# Patient Record
Sex: Female | Born: 1965 | Marital: Married | Smoking: Never smoker
Health system: Southern US, Community
[De-identification: ages and names within clinical notes are randomized; demographics above are authoritative.]

## PROBLEM LIST (undated history)

## (undated) DIAGNOSIS — Z9289 Personal history of other medical treatment: Secondary | ICD-10-CM

## (undated) DIAGNOSIS — E785 Hyperlipidemia, unspecified: Secondary | ICD-10-CM

## (undated) HISTORY — DX: Hyperlipidemia, unspecified: E78.5

## (undated) HISTORY — DX: Personal history of other medical treatment: Z92.89

---

## 2009-01-11 ENCOUNTER — Ambulatory Visit: Payer: Self-pay | Admitting: Obstetrics and Gynecology

## 2012-07-15 ENCOUNTER — Ambulatory Visit: Payer: Self-pay

## 2013-05-11 ENCOUNTER — Ambulatory Visit: Payer: Self-pay | Admitting: Family Medicine

## 2013-05-11 LAB — COMPREHENSIVE METABOLIC PANEL
Albumin: 3.7 g/dL (ref 3.4–5.0)
Alkaline Phosphatase: 84 U/L (ref 50–136)
Anion Gap: 8 (ref 7–16)
BUN: 12 mg/dL (ref 7–18)
Bilirubin,Total: 0.2 mg/dL (ref 0.2–1.0)
Calcium, Total: 9.4 mg/dL (ref 8.5–10.1)
Co2: 31 mmol/L (ref 21–32)
Creatinine: 1.01 mg/dL (ref 0.60–1.30)
EGFR (African American): >60
EGFR (Non-African Amer.): >60
Glucose: 108 mg/dL — ABNORMAL HIGH (ref 65–99)
Osmolality: 285 (ref 275–301)
Potassium: 4 mmol/L (ref 3.5–5.1)
SGPT (ALT): 29 U/L (ref 12–78)
Total Protein: 7.7 g/dL (ref 6.4–8.2)

## 2013-05-11 LAB — CBC WITH DIFFERENTIAL/PLATELET
Basophil %: 0.4 %
HCT: 37.8 % (ref 35.0–47.0)
HGB: 12.1 g/dL (ref 12.0–16.0)
Lymphocyte %: 36.1 %
MCH: 24.5 pg — ABNORMAL LOW (ref 26.0–34.0)
MCHC: 32 g/dL (ref 32.0–36.0)
MCV: 77 fL — ABNORMAL LOW (ref 80–100)
Monocyte #: 0.4 x10 3/mm (ref 0.2–0.9)
Monocyte %: 8.6 %
Platelet: 249 10*3/uL (ref 150–440)
RBC: 4.94 10*6/uL (ref 3.80–5.20)
RDW: 14.6 % — ABNORMAL HIGH (ref 11.5–14.5)
WBC: 5.2 10*3/uL (ref 3.6–11.0)

## 2013-05-11 LAB — TROPONIN I: Troponin-I: <0.02 ng/mL

## 2013-05-11 LAB — TSH: Thyroid Stimulating Horm: 1.05 u[IU]/mL

## 2014-06-13 IMAGING — CT CT HEAD WITHOUT AND WITH CONTRAST
1 of 2 series · 13 of 30 positions shown, 17 images · IV contrast (isovue)
Comparison: none

REASON FOR EXAM: ext pain tingling  numbness eval for mass or lesion
COMMENTS:

PROCEDURE:     [facility] - [facility]ANY [facility]/WAL  - May 11, 2013  [DATE]
RESULT:     Comparison:  None
TECHNIQUE: Multiple axial images from the foramen magnum to the vertex were
obtained with and without 50 mL Isovue 300 IV contrast.

[Series 2: soft tissue wo · axial · 0.42mm/px · z∈[-136,-16]mm · 13 of 30 slices shown, 17 images]
[im 3/30  brain]
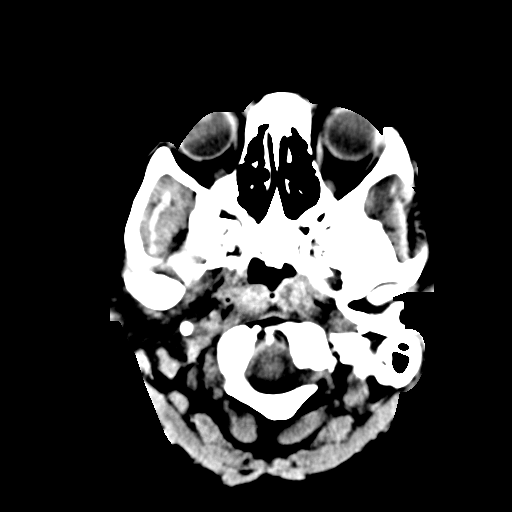
[im 3/30  bone]
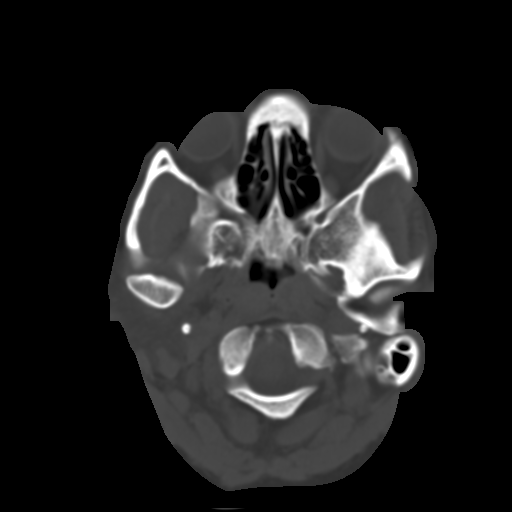
[im 5/30  brain]
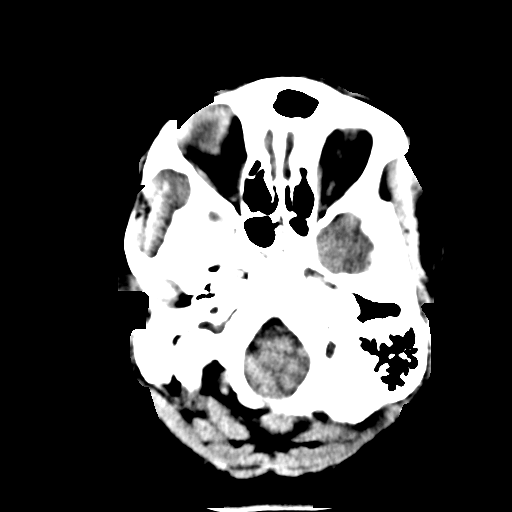
[im 7/30  brain]
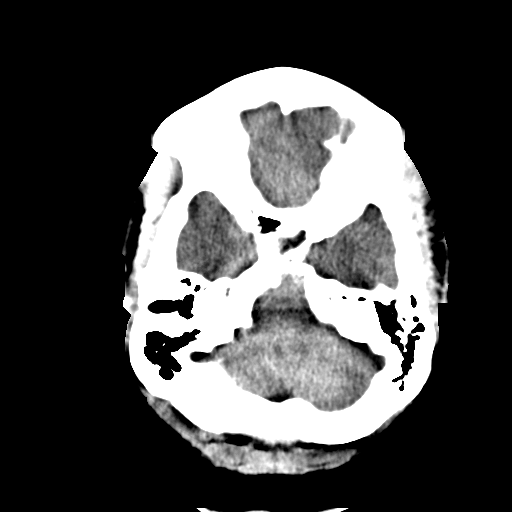
[im 9/30  brain]
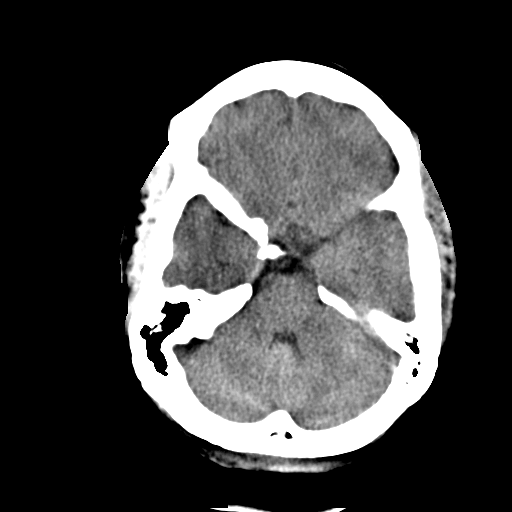
[im 11/30  brain]
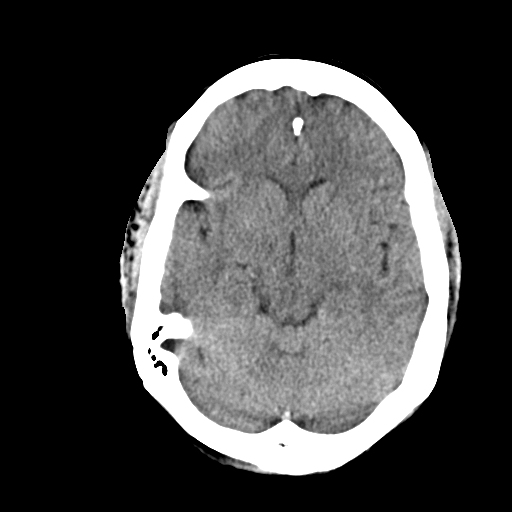
[im 11/30  bone]
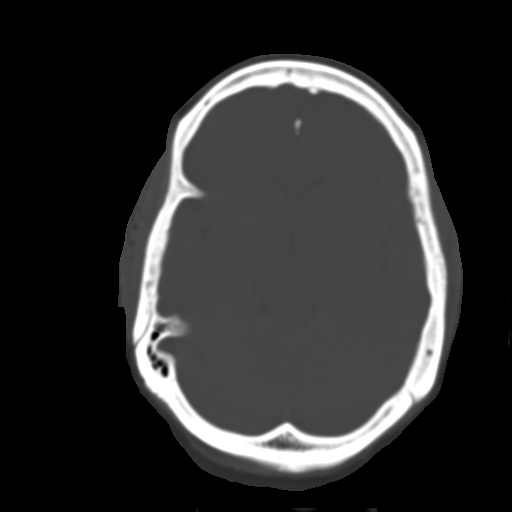
[im 13/30  brain]
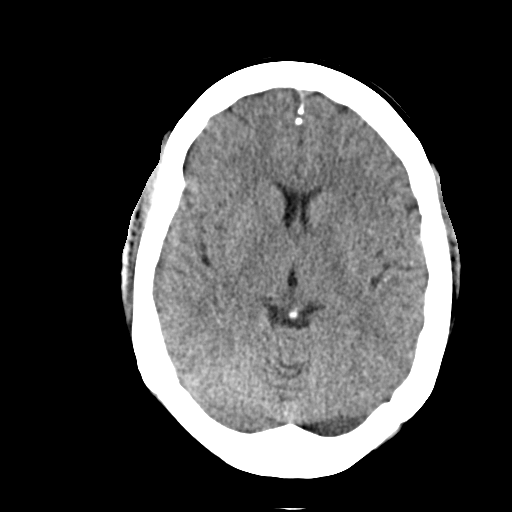
[im 15/30  brain]
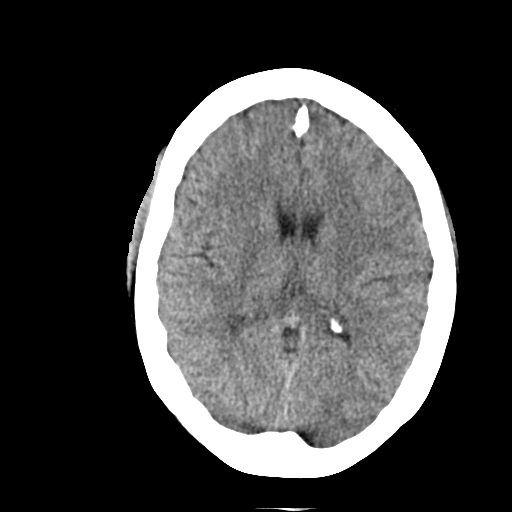
[im 17/30  brain]
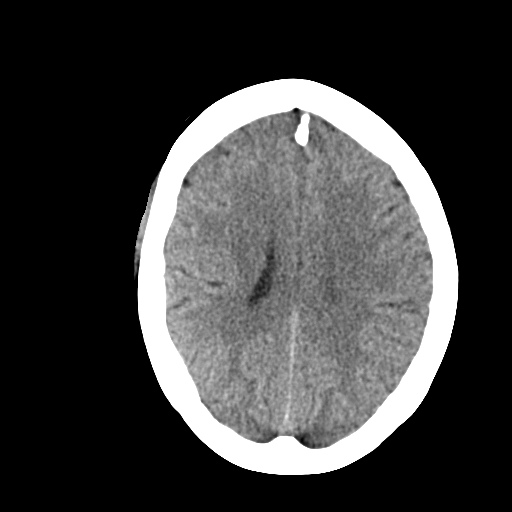
[im 19/30  brain]
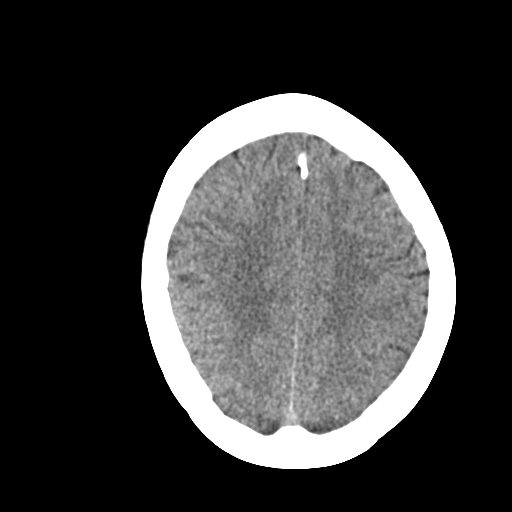
[im 19/30  bone]
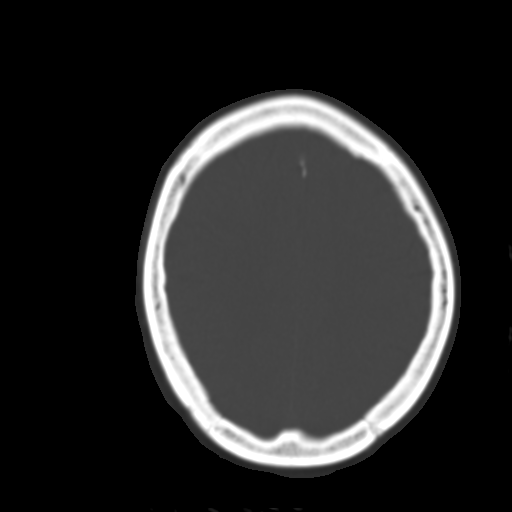
[im 21/30  brain]
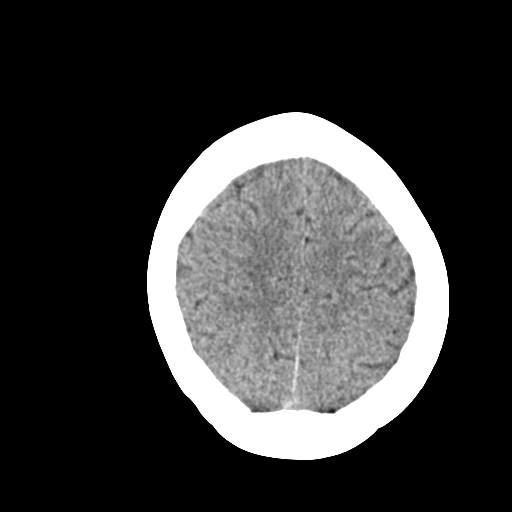
[im 23/30  brain]
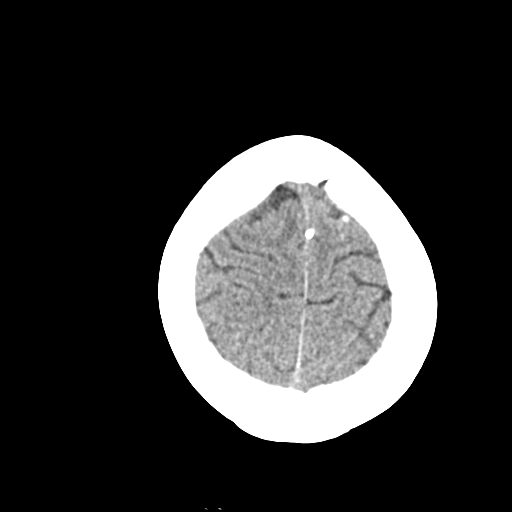
[im 25/30  brain]
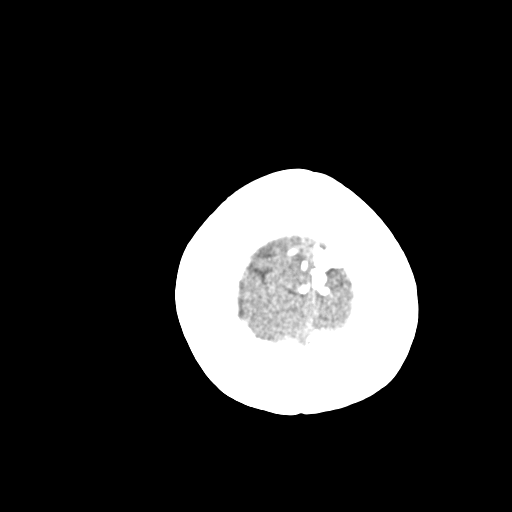
[im 27/30  brain]
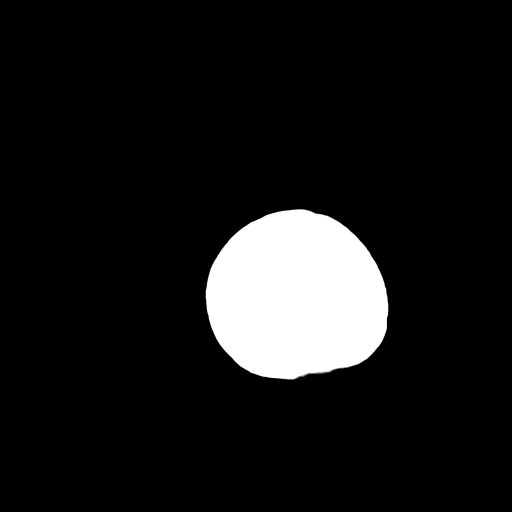
[im 27/30  bone]
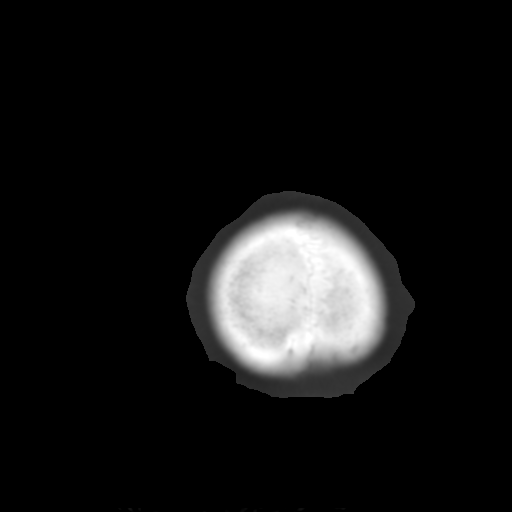

[13 of 30 positions shown; findings below may reference images not displayed]

FINDINGS: There is no evidence for mass effect, midline shift, or extra-axial fluid
collections. There is no evidence for space-occupying lesion, intracranial
hemorrhage, or cortical-based area of infarction. No abnormal areas of
enhancement.

The osseous structures are unremarkable.
IMPRESSION: No acute intracranial findings. No abnormal areas of enhancement.

If there is continued clinical concern, further evaluation could be provided
with contrast enhanced brain MRI.

## 2015-03-16 LAB — HM MAMMOGRAPHY

## 2015-03-22 LAB — HM PAP SMEAR

## 2015-10-25 ENCOUNTER — Encounter: Payer: Self-pay | Admitting: Internal Medicine

## 2015-10-25 DIAGNOSIS — E785 Hyperlipidemia, unspecified: Secondary | ICD-10-CM | POA: Insufficient documentation

## 2017-01-28 DIAGNOSIS — I10 Essential (primary) hypertension: Secondary | ICD-10-CM | POA: Insufficient documentation

## 2017-12-25 ENCOUNTER — Other Ambulatory Visit: Payer: Self-pay | Admitting: Family Medicine

## 2017-12-25 DIAGNOSIS — Z1239 Encounter for other screening for malignant neoplasm of breast: Secondary | ICD-10-CM

## 2017-12-29 ENCOUNTER — Encounter: Payer: Self-pay | Admitting: Advanced Practice Midwife

## 2017-12-29 ENCOUNTER — Ambulatory Visit (INDEPENDENT_AMBULATORY_CARE_PROVIDER_SITE_OTHER): Payer: BC Managed Care – PPO | Admitting: Advanced Practice Midwife

## 2017-12-29 VITALS — BP 130/80 | HR 66 | Ht 65.75 in | Wt 189.0 lb

## 2017-12-29 DIAGNOSIS — Z01419 Encounter for gynecological examination (general) (routine) without abnormal findings: Secondary | ICD-10-CM

## 2017-12-29 DIAGNOSIS — Z124 Encounter for screening for malignant neoplasm of cervix: Secondary | ICD-10-CM | POA: Diagnosis not present

## 2017-12-29 NOTE — Patient Instructions (Signed)
American Heart Association (AHA) Exercise Recommendation  Being physically active is important to prevent heart disease and stroke, the nation's No. 1and No. 5killers. To improve overall cardiovascular health, we suggest at least 150 minutes per week of moderate exercise or 75 minutes per week of vigorous exercise (or a combination of moderate and vigorous activity). Thirty minutes a day, five times a week is an easy goal to remember. You will also experience benefits even if you divide your time into two or three segments of 10 to 15 minutes per day.  For people who would benefit from lowering their blood pressure or cholesterol, we recommend 40 minutes of aerobic exercise of moderate to vigorous intensity three to four times a week to lower the risk for heart attack and stroke.  Physical activity is anything that makes you move your body and burn calories.  This includes things like climbing stairs or playing sports. Aerobic exercises benefit your heart, and include walking, jogging, swimming or biking. Strength and stretching exercises are best for overall stamina and flexibility.  The simplest, positive change you can make to effectively improve your heart health is to start walking. It's enjoyable, free, easy, social and great exercise. A walking program is flexible and boasts high success rates because people can stick with it. It's easy for walking to become a regular and satisfying part of life.   For Overall Cardiovascular Health:  At least 30 minutes of moderate-intensity aerobic activity at least 5 days per week for a total of 150  OR   At least 25 minutes of vigorous aerobic activity at least 3 days per week for a total of 75 minutes; or a combination of moderate- and vigorous-intensity aerobic activity  AND   Moderate- to high-intensity muscle-strengthening activity at least 2 days per week for additional health benefits.  For Lowering Blood Pressure and Cholesterol  An  average 40 minutes of moderate- to vigorous-intensity aerobic activity 3 or 4 times per week  What if I can't make it to the time goal? Something is always better than nothing! And everyone has to start somewhere. Even if you've been sedentary for years, today is the day you can begin to make healthy changes in your life. If you don't think you'll make it for 30 or 40 minutes, set a reachable goal for today. You can work up toward your overall goal by increasing your time as you get stronger. Don't let all-or-nothing thinking rob you of doing what you can every day.  Source:http://www.heart.org   Mediterranean Diet A Mediterranean diet refers to food and lifestyle choices that are based on the traditions of countries located on the [employer]. This way of eating has been shown to help prevent certain conditions and improve outcomes for people who have chronic diseases, like kidney disease and heart disease. What are tips for following this plan? Lifestyle  Cook and eat meals together with your family, when possible.  Drink enough fluid to keep your urine clear or pale yellow.  Be physically active every day. This includes: ? Aerobic exercise like running or swimming. ? Leisure activities like gardening, walking, or housework.  Get 7-8 hours of sleep each night.  If recommended by your health care provider, drink red wine in moderation. This means 1 glass a day for nonpregnant women and 2 glasses a day for men. A glass of wine equals 5 oz (150 mL). Reading food labels  Check the serving size of packaged foods. For foods such as rice  and pasta, the serving size refers to the amount of cooked product, not dry.  Check the total fat in packaged foods. Avoid foods that have saturated fat or trans fats.  Check the ingredients list for added sugars, such as corn syrup. Shopping  At the grocery store, buy most of your food from the areas near the walls of the store. This  includes: ? Fresh fruits and vegetables (produce). ? Grains, beans, nuts, and seeds. Some of these may be available in unpackaged forms or large amounts (in bulk). ? Fresh seafood. ? Poultry and eggs. ? Low-fat dairy products.  Buy whole ingredients instead of prepackaged foods.  Buy fresh fruits and vegetables in-season from local farmers markets.  Buy frozen fruits and vegetables in resealable bags.  If you do not have access to quality fresh seafood, buy precooked frozen shrimp or canned fish, such as tuna, salmon, or sardines.  Buy small amounts of raw or cooked vegetables, salads, or olives from the deli or salad bar at your store.  Stock your pantry so you always have certain foods on hand, such as olive oil, canned tuna, canned tomatoes, rice, pasta, and beans. Cooking  Cook foods with extra-virgin olive oil instead of using butter or other vegetable oils.  Have meat as a side dish, and have vegetables or grains as your main dish. This means having meat in small portions or adding small amounts of meat to foods like pasta or stew.  Use beans or vegetables instead of meat in common dishes like chili or lasagna.  Experiment with different cooking methods. Try roasting or broiling vegetables instead of steaming or sauteing them.  Add frozen vegetables to soups, stews, pasta, or rice.  Add nuts or seeds for added healthy fat at each meal. You can add these to yogurt, salads, or vegetable dishes.  Marinate fish or vegetables using olive oil, lemon juice, garlic, and fresh herbs. Meal planning  Plan to eat 1 vegetarian meal one day each week. Try to work up to 2 vegetarian meals, if possible.  Eat seafood 2 or more times a week.  Have healthy snacks readily available, such as: ? Vegetable sticks with hummus. ? [place] yogurt. ? Fruit and nut trail mix.  Eat balanced meals throughout the week. This includes: ? Fruit: 2-3 servings a day ? Vegetables: 4-5 servings a  day ? Low-fat dairy: 2 servings a day ? Fish, poultry, or lean meat: 1 serving a day ? Beans and legumes: 2 or more servings a week ? Nuts and seeds: 1-2 servings a day ? Whole grains: 6-8 servings a day ? Extra-virgin olive oil: 3-4 servings a day  Limit red meat and sweets to only a few servings a month What are my food choices?  Mediterranean diet ? Recommended ? Grains: Whole-grain pasta. Brown rice. Bulgar wheat. Polenta. Couscous. Whole-wheat bread. [place]. ? Vegetables: Artichokes. Beets. Broccoli. Cabbage. Carrots. Eggplant. Green beans. Chard. Kale. Spinach. Onions. Leeks. Peas. Squash. Tomatoes. Peppers. Radishes. ? Fruits: Apples. Apricots. Avocado. Berries. Bananas. Cherries. Dates. Figs. Grapes. Lemons. Melon. Oranges. Peaches. Plums. Pomegranate. ? Meats and other protein foods: Beans. Almonds. Sunflower seeds. Pine nuts. Peanuts. [place]. Salmon. Scallops. Shrimp. [place]. Tilapia. Clams. Oysters. Eggs. ? Dairy: Low-fat milk. Cheese. Greek yogurt. ? Beverages: Water. Red wine. Herbal tea. ? Fats and oils: Extra virgin olive oil. Avocado oil. Grape seed oil. ? Sweets and desserts: [place] yogurt with honey. Baked apples. Poached pears. Trail mix. ? Seasoning and other foods: Basil. Cilantro. Coriander.  Cumin. Mint. Parsley. Sage. Rosemary. Tarragon. Garlic. Oregano. Thyme. Pepper. Balsalmic vinegar. Tahini. Hummus. Tomato sauce. Olives. Mushrooms. ? Limit these ? Grains: Prepackaged pasta or rice dishes. Prepackaged cereal with added sugar. ? Vegetables: Deep fried potatoes (french fries). ? Fruits: Fruit canned in syrup. ? Meats and other protein foods: Beef. Pork. Lamb. Poultry with skin. Hot dogs. Bacon. ? Dairy: Ice cream. Sour cream. Whole milk. ? Beverages: Juice. Sugar-sweetened soft drinks. Beer. Liquor and spirits. ? Fats and oils: Butter. Canola oil. Vegetable oil. Beef fat (tallow). Lard. ? Sweets and desserts: Cookies. Cakes. Pies. Candy. ? Seasoning and other  foods: Mayonnaise. Premade sauces and marinades. ? The items listed may not be a complete list. Talk with your dietitian about what dietary choices are right for you. Summary  The Mediterranean diet includes both food and lifestyle choices.  Eat a variety of fresh fruits and vegetables, beans, nuts, seeds, and whole grains.  Limit the amount of red meat and sweets that you eat.  Talk with your health care provider about whether it is safe for you to drink red wine in moderation. This means 1 glass a day for nonpregnant women and 2 glasses a day for men. A glass of wine equals 5 oz (150 mL). This information is not intended to replace advice given to you by your health care provider. Make sure you discuss any questions you have with your health care provider. Document Released: 06/19/2016 Document Revised: 07/22/2016 Document Reviewed: 06/19/2016 Elsevier Interactive Patient Education  2018 [employer] Health Maintenance, Female Adopting a healthy lifestyle and getting preventive care can go a long way to promote health and wellness. Talk with your health care provider about what schedule of regular examinations is right for you. This is a good chance for you to check in with your provider about disease prevention and staying healthy. In between checkups, there are plenty of things you can do on your own. Experts have done a lot of research about which lifestyle changes and preventive measures are most likely to keep you healthy. Ask your health care provider for more information. Weight and diet Eat a healthy diet  Be sure to include plenty of vegetables, fruits, low-fat dairy products, and lean protein.  Do not eat a lot of foods high in solid fats, added sugars, or salt.  Get regular exercise. This is one of the most important things you can do for your health. ? Most adults should exercise for at least 150 minutes each week. The exercise should increase your heart rate and make you  sweat (moderate-intensity exercise). ? Most adults should also do strengthening exercises at least twice a week. This is in addition to the moderate-intensity exercise.  Maintain a healthy weight  Body mass index (BMI) is a measurement that can be used to identify possible weight problems. It estimates body fat based on height and weight. Your health care provider can help determine your BMI and help you achieve or maintain a healthy weight.  For females 20 years of age and older: ? A BMI below 18.5 is considered underweight. ? A BMI of 18.5 to 24.9 is normal. ? A BMI of 25 to 29.9 is considered overweight. ? A BMI of 30 and above is considered obese.  Watch levels of cholesterol and blood lipids  You should start having your blood tested for lipids and cholesterol at 53 years of age, then have this test every 5 years.  You may need to have your cholesterol levels   checked more often if: ? Your lipid or cholesterol levels are high. ? You are older than 53 years of age. ? You are at high risk for heart disease.  Cancer screening Lung Cancer  Lung cancer screening is recommended for adults 34-81 years old who are at high risk for lung cancer because of a history of smoking.  A yearly low-dose CT scan of the lungs is recommended for people who: ? Currently smoke. ? Have quit within the past 15 years. ? Have at least a 30-pack-year history of smoking. A pack year is smoking an average of one pack of cigarettes a day for 1 year.  Yearly screening should continue until it has been 15 years since you quit.  Yearly screening should stop if you develop a health problem that would prevent you from having lung cancer treatment.  Breast Cancer  Practice breast self-awareness. This means understanding how your breasts normally appear and feel.  It also means doing regular breast self-exams. Let your health care provider know about any changes, no matter how small.  If you are in your 20s  or 30s, you should have a clinical breast exam (CBE) by a health care provider every 1-3 years as part of a regular health exam.  If you are 62 or older, have a CBE every year. Also consider having a breast X-ray (mammogram) every year.  If you have a family history of breast cancer, talk to your health care provider about genetic screening.  If you are at high risk for breast cancer, talk to your health care provider about having an MRI and a mammogram every year.  Breast cancer gene (BRCA) assessment is recommended for women who have family members with BRCA-related cancers. BRCA-related cancers include: ? Breast. ? Ovarian. ? Tubal. ? Peritoneal cancers.  Results of the assessment will determine the need for genetic counseling and BRCA1 and BRCA2 testing.  Cervical Cancer Your health care provider may recommend that you be screened regularly for cancer of the pelvic organs (ovaries, uterus, and vagina). This screening involves a pelvic examination, including checking for microscopic changes to the surface of your cervix (Pap test). You may be encouraged to have this screening done every 3 years, beginning at age 8.  For women ages 52-65, health care providers may recommend pelvic exams and Pap testing every 3 years, or they may recommend the Pap and pelvic exam, combined with testing for human papilloma virus (HPV), every 5 years. Some types of HPV increase your risk of cervical cancer. Testing for HPV may also be done on women of any age with unclear Pap test results.  Other health care providers may not recommend any screening for nonpregnant women who are considered low risk for pelvic cancer and who do not have symptoms. Ask your health care provider if a screening pelvic exam is right for you.  If you have had past treatment for cervical cancer or a condition that could lead to cancer, you need Pap tests and screening for cancer for at least 20 years after your treatment. If Pap tests  have been discontinued, your risk factors (such as having a new sexual partner) need to be reassessed to determine if screening should resume. Some women have medical problems that increase the chance of getting cervical cancer. In these cases, your health care provider may recommend more frequent screening and Pap tests.  Colorectal Cancer  This type of cancer can be detected and often prevented.  Routine colorectal cancer screening  usually begins at 53 years of age and continues through 53 years of age.  Your health care provider may recommend screening at an earlier age if you have risk factors for colon cancer.  Your health care provider may also recommend using home test kits to check for hidden blood in the stool.  A small camera at the end of a tube can be used to examine your colon directly (sigmoidoscopy or colonoscopy). This is done to check for the earliest forms of colorectal cancer.  Routine screening usually begins at age 8.  Direct examination of the colon should be repeated every 5-10 years through 53 years of age. However, you may need to be screened more often if early forms of precancerous polyps or small growths are found.  Skin Cancer  Check your skin from head to toe regularly.  Tell your health care provider about any new moles or changes in moles, especially if there is a change in a mole's shape or color.  Also tell your health care provider if you have a mole that is larger than the size of a pencil eraser.  Always use sunscreen. Apply sunscreen liberally and repeatedly throughout the day.  Protect yourself by wearing long sleeves, pants, a wide-brimmed hat, and sunglasses whenever you are outside.  Heart disease, diabetes, and high blood pressure  High blood pressure causes heart disease and increases the risk of stroke. High blood pressure is more likely to develop in: ? People who have blood pressure in the high end of the normal range (130-139/85-89 mm  Hg). ? People who are overweight or obese. ? People who are African American.  If you are 29-23 years of age, have your blood pressure checked every 3-5 years. If you are 68 years of age or older, have your blood pressure checked every year. You should have your blood pressure measured twice-once when you are at a hospital or clinic, and once when you are not at a hospital or clinic. Record the average of the two measurements. To check your blood pressure when you are not at a hospital or clinic, you can use: ? An automated blood pressure machine at a pharmacy. ? A home blood pressure monitor.  If you are between 82 years and 71 years old, ask your health care provider if you should take aspirin to prevent strokes.  Have regular diabetes screenings. This involves taking a blood sample to check your fasting blood sugar level. ? If you are at a normal weight and have a low risk for diabetes, have this test once every three years after 53 years of age. ? If you are overweight and have a high risk for diabetes, consider being tested at a younger age or more often. Preventing infection Hepatitis B  If you have a higher risk for hepatitis B, you should be screened for this virus. You are considered at high risk for hepatitis B if: ? You were born in a country where hepatitis B is common. Ask your health care provider which countries are considered high risk. ? Your parents were born in a high-risk country, and you have not been immunized against hepatitis B (hepatitis B vaccine). ? You have HIV or AIDS. ? You use needles to inject street drugs. ? You live with someone who has hepatitis B. ? You have had sex with someone who has hepatitis B. ? You get hemodialysis treatment. ? You take certain medicines for conditions, including cancer, organ transplantation, and autoimmune conditions.  Hepatitis C  Blood testing is recommended for: ? Everyone born from [birth year] through 1966. ? Anyone with known  risk factors for hepatitis C.  Sexually transmitted infections (STIs)  You should be screened for sexually transmitted infections (STIs) including gonorrhea and chlamydia if: ? You are sexually active and are younger than 53 years of age. ? You are older than 53 years of age and your health care provider tells you that you are at risk for this type of infection. ? Your sexual activity has changed since you were last screened and you are at an increased risk for chlamydia or gonorrhea. Ask your health care provider if you are at risk.  If you do not have HIV, but are at risk, it may be recommended that you take a prescription medicine daily to prevent HIV infection. This is called pre-exposure prophylaxis (PrEP). You are considered at risk if: ? You are sexually active and do not regularly use condoms or know the HIV status of your partner(s). ? You take drugs by injection. ? You are sexually active with a partner who has HIV.  Talk with your health care provider about whether you are at high risk of being infected with HIV. If you choose to begin PrEP, you should first be tested for HIV. You should then be tested every 3 months for as long as you are taking PrEP. Pregnancy  If you are premenopausal and you may become pregnant, ask your health care provider about preconception counseling.  If you may become pregnant, take 400 to 800 micrograms (mcg) of folic acid every day.  If you want to prevent pregnancy, talk to your health care provider about birth control (contraception). Osteoporosis and menopause  Osteoporosis is a disease in which the bones lose minerals and strength with aging. This can result in serious bone fractures. Your risk for osteoporosis can be identified using a bone density scan.  If you are 74 years of age or older, or if you are at risk for osteoporosis and fractures, ask your health care provider if you should be screened.  Ask your health care provider whether you  should take a calcium or vitamin D supplement to lower your risk for osteoporosis.  Menopause may have certain physical symptoms and risks.  Hormone replacement therapy may reduce some of these symptoms and risks. Talk to your health care provider about whether hormone replacement therapy is right for you. Follow these instructions at home:  Schedule regular health, dental, and eye exams.  Stay current with your immunizations.  Do not use any tobacco products including cigarettes, chewing tobacco, or electronic cigarettes.  If you are pregnant, do not drink alcohol.  If you are breastfeeding, limit how much and how often you drink alcohol.  Limit alcohol intake to no more than 1 drink per day for nonpregnant women. One drink equals 12 ounces of beer, 5 ounces of wine, or 1 ounces of hard liquor.  Do not use street drugs.  Do not share needles.  Ask your health care provider for help if you need support or information about quitting drugs.  Tell your health care provider if you often feel depressed.  Tell your health care provider if you have ever been abused or do not feel safe at home. This information is not intended to replace advice given to you by your health care provider. Make sure you discuss any questions you have with your health care provider. Document Released: 05/12/2011 Document Revised: 04/03/2016 Document Reviewed:  07/31/2015 Elsevier Interactive Patient Education  [employer].

## 2017-12-29 NOTE — Progress Notes (Signed)
Patient ID: Morgan Bartlett, female   DOB: 07/04/1966, 52 y.o.   MRN: [id number]      Gynecology Annual Exam  PCP: Patient, No Pcp Per  Chief Complaint:  Chief Complaint  Patient presents with  . Gynecologic Exam    History of Present Illness:Patient is a 52 y.o. [id number] presents for annual exam. The patient has no complaints today.   LMP: No LMP recorded (lmp unknown). Patient is postmenopausal  Postcoital Bleeding: no Dysmenorrhea: not applicable  The patient is sexually active. She denies dyspareunia.  The patient does perform self breast exams.  There is no notable family history of breast or ovarian cancer in her family.  The patient wears seatbelts: yes.   The patient has regular exercise: yes.  She admits to healthy lifestyle diet, hydration and exercise.  The patient denies current symptoms of depression.     Review of Systems: Review of Systems  Constitutional: Negative.   HENT: Negative.   Eyes: Negative.   Respiratory: Negative.   Cardiovascular: Negative.   Gastrointestinal: Negative.   Genitourinary: Negative.   Musculoskeletal: Negative.   Skin: Negative.   Neurological: Negative.   Endo/Heme/Allergies: Negative.   Psychiatric/Behavioral: Negative.     Past Medical History:  Past Medical History:  Diagnosis Date  . History of mammogram 01/11/09; 03/16/15   cat 2 at [facility]; BENIGN  . History of Papanicolaou smear of cervix 12/27/08; 03/22/15   -/-; -/-  . Hyperlipidemia     Past Surgical History:  Past Surgical History:  Procedure Laterality Date  . CESAREAN SECTION      Gynecologic History:  No LMP recorded (lmp unknown). Last Pap: 3 years ago Results were:  no abnormalities  Last mammogram: 3 years ago Results were: BI-RAD I  Obstetric History: [id number]: [id number]  Family History:  Family History  Problem Relation Age of Onset  . Leukemia Sister        DIED 2014    Social History:  Social History   Socioeconomic History  . Marital status: Married     Spouse name: Not on file  . Number of children: 3  . Years of education: Not on file  . Highest education level: Not on file  Social Needs  . Financial resource strain: Not on file  . Food insecurity - worry: Not on file  . Food insecurity - inability: Not on file  . Transportation needs - medical: Not on file  . Transportation needs - non-medical: Not on file  Occupational History  . Not on file  Tobacco Use  . Smoking status: Never Smoker  . Smokeless tobacco: Never Used  Substance and Sexual Activity  . Alcohol use: No    Alcohol/week: 1.2 oz    Types: 2 Standard drinks or equivalent per week    Frequency: Never  . Drug use: No  . Sexual activity: Yes    Birth control/protection: Post-menopausal  Other Topics Concern  . Not on file  Social History Narrative  . Not on file    Allergies:  No Known Allergies  Medications: Prior to Admission medications   Medication Sig Start Date End Date Taking? Authorizing Provider  aspirin EC 81 MG tablet Take 1 tablet by mouth daily. 02/11/17  Yes [provider]  ibuprofen (ADVIL,MOTRIN) 600 MG tablet Take 600 mg by mouth as needed. 03/27/14  Yes [provider]  Multiple Vitamin (MULTI-VITAMINS) TABS Take 1 tablet by mouth daily.   Yes [provider]    Physical Exam Vitals:  Blood pressure 130/80, pulse 66, height 5' 5.75" (1.67 m), weight 189 lb (85.7 kg).  General: NAD HEENT: normocephalic, anicteric Thyroid: no enlargement, no palpable nodules Pulmonary: No increased work of breathing, CTAB Cardiovascular: RRR, distal pulses 2+ Breast: Breast symmetrical, no tenderness, no palpable nodules or masses, no skin or nipple retraction present, no nipple discharge.  No axillary or supraclavicular lymphadenopathy. Abdomen: NABS, soft, non-tender, non-distended.  Umbilicus without lesions.  No hepatomegaly, splenomegaly or masses palpable. No evidence of hernia  Genitourinary:  External: Normal external  female genitalia.  Normal urethral meatus, normal  Bartholin's and Skene's glands.    Vagina: Normal vaginal mucosa, no evidence of prolapse.    Cervix: Grossly normal in appearance, no bleeding  Uterus: Non-enlarged, mobile, normal contour.  No CMT  Adnexa: ovaries non-enlarged, no adnexal masses  Rectal: deferred  Lymphatic: no evidence of inguinal lymphadenopathy Extremities: no edema, erythema, or tenderness Neurologic: Grossly intact Psychiatric: mood appropriate, affect full  Female chaperone present for pelvic and breast  portions of the physical exam     Assessment: 52 y.o. [id number] routine annual exam  Plan: Problem List Items Addressed This Visit    None    Visit Diagnoses    Well woman exam with routine gynecological exam    -  Primary   Relevant Orders   MM DIGITAL SCREENING BILATERAL   IGP, Aptima HPV   Cervical cancer screening       Relevant Orders   IGP, Aptima HPV      1) Mammogram - recommend yearly screening mammogram.  Mammogram Was ordered today  2) STI screening was offered and declined  3) ASCCP guidelines and rational discussed.  Patient opts for every 3 years screening interval  4) Osteoporosis  - per USPTF routine screening DEXA at age 68  Consider FDA-approved medical therapies in postmenopausal women and men aged 60 years and older, based on the following: a) A hip or vertebral (clinical or morphometric) fracture b) T-score ? -2.5 at the femoral neck or spine after appropriate evaluation to exclude secondary causes C) Low bone mass (T-score between -1.0 and -2.5 at the femoral neck or spine) and a 10-year probability of a hip fracture ? 3% or a 10-year probability of a major osteoporosis-related fracture ? 20% based on the [place]-adapted WHO algorithm   5) Routine healthcare maintenance including cholesterol, diabetes screening discussed managed by PCP  6) Colonoscopy Up to date.  Screening recommended starting at age 33 for average risk  individuals, age 29 for individuals deemed at increased risk (including African Americans) and recommended to continue until age 59.  For patient age 53-85 individualized approach is recommended.  Gold standard screening is via colonoscopy, Cologuard screening is an acceptable alternative for patient unwilling or unable to undergo colonoscopy.  "Colorectal cancer screening for average?risk adults: 2018 guideline update from the American Cancer Society"CA: A Cancer Journal for Clinicians: Apr 08, 2017   7) Return in 1 year (on 12/29/2018) for annual established gyn.   Tresea Mall, CNM [facility]  12/29/2017, 11:37 AM

## 2017-12-31 LAB — IGP, APTIMA HPV
HPV Aptima: NEGATIVE
PAP SMEAR COMMENT: 0

## 2018-01-12 ENCOUNTER — Ambulatory Visit
Admission: RE | Admit: 2018-01-12 | Discharge: 2018-01-12 | Disposition: A | Discharge status: Home / Self Care | Payer: BC Managed Care – PPO | Source: Ambulatory Visit | Attending: Advanced Practice Midwife | Admitting: Advanced Practice Midwife

## 2018-01-12 ENCOUNTER — Encounter: Payer: Self-pay | Admitting: Radiology

## 2018-01-12 DIAGNOSIS — Z1231 Encounter for screening mammogram for malignant neoplasm of breast: Secondary | ICD-10-CM | POA: Insufficient documentation

## 2018-01-12 DIAGNOSIS — R928 Other abnormal and inconclusive findings on diagnostic imaging of breast: Secondary | ICD-10-CM | POA: Insufficient documentation

## 2018-01-12 DIAGNOSIS — Z01419 Encounter for gynecological examination (general) (routine) without abnormal findings: Secondary | ICD-10-CM | POA: Insufficient documentation

## 2018-01-19 ENCOUNTER — Other Ambulatory Visit: Payer: Self-pay | Admitting: *Deleted

## 2018-01-19 ENCOUNTER — Inpatient Hospital Stay
Admission: RE | Admit: 2018-01-19 | Discharge: 2018-01-19 | Disposition: A | Discharge status: Home / Self Care | Payer: Self-pay | Source: Ambulatory Visit | Attending: *Deleted | Admitting: *Deleted

## 2018-01-19 DIAGNOSIS — Z9289 Personal history of other medical treatment: Secondary | ICD-10-CM

## 2018-01-21 ENCOUNTER — Other Ambulatory Visit: Payer: Self-pay | Admitting: Advanced Practice Midwife

## 2018-01-21 DIAGNOSIS — N6310 Unspecified lump in the right breast, unspecified quadrant: Secondary | ICD-10-CM

## 2018-01-21 DIAGNOSIS — R928 Other abnormal and inconclusive findings on diagnostic imaging of breast: Secondary | ICD-10-CM

## 2018-01-28 ENCOUNTER — Other Ambulatory Visit: Payer: Self-pay | Admitting: Advanced Practice Midwife

## 2018-01-28 ENCOUNTER — Ambulatory Visit
Admission: RE | Admit: 2018-01-28 | Discharge: 2018-01-28 | Disposition: A | Discharge status: Home / Self Care | Payer: Self-pay | Source: Ambulatory Visit | Attending: Advanced Practice Midwife | Admitting: Advanced Practice Midwife

## 2018-01-28 DIAGNOSIS — N6310 Unspecified lump in the right breast, unspecified quadrant: Secondary | ICD-10-CM

## 2018-01-28 DIAGNOSIS — R928 Other abnormal and inconclusive findings on diagnostic imaging of breast: Secondary | ICD-10-CM

## 2018-01-28 DIAGNOSIS — R921 Mammographic calcification found on diagnostic imaging of breast: Secondary | ICD-10-CM

## 2018-02-04 ENCOUNTER — Ambulatory Visit: Payer: Self-pay

## 2018-07-21 ENCOUNTER — Other Ambulatory Visit: Payer: Self-pay

## 2018-07-21 ENCOUNTER — Ambulatory Visit
Admission: EM | Admit: 2018-07-21 | Discharge: 2018-07-21 | Disposition: A | Discharge status: Home / Self Care | Payer: Self-pay | Attending: Internal Medicine | Admitting: Internal Medicine

## 2018-07-21 ENCOUNTER — Encounter: Payer: Self-pay | Admitting: Emergency Medicine

## 2018-07-21 DIAGNOSIS — M79622 Pain in left upper arm: Secondary | ICD-10-CM

## 2018-07-21 DIAGNOSIS — M79605 Pain in left leg: Secondary | ICD-10-CM

## 2018-07-21 DIAGNOSIS — R51 Headache: Secondary | ICD-10-CM

## 2018-07-21 DIAGNOSIS — R519 Headache, unspecified: Secondary | ICD-10-CM

## 2018-07-21 NOTE — Discharge Instructions (Addendum)
Right now I do not find signs of stroke and your neurological exam is completely normal. If you get worse over night, go to the Er. Review the symptoms if stroke I told you to watch out with your husband and if you have those changes he needs to take you to the ER Follow up with family Dr this week.  If you are worried and want more test done right away, then you need to go to ER.

## 2018-07-21 NOTE — ED Triage Notes (Signed)
Patient c/o "soreness" in left side of her face, left arm and left leg that started on Sunday.

## 2018-07-21 NOTE — ED Provider Notes (Signed)
[facility]    CSN: [id number] Arrival date & time: 07/21/18  1931     History   Chief Complaint Chief Complaint  Patient presents with  . Muscle Pain    HPI SABENA WINNER is a 52 y.o. female.   Has been having L face soreness, L arm and L leg since this am. States she did not injured herself and denies weakness or numbness on L side. Denies a headache, though she had past hx of migraines. No family hx of CVA or aneurysms. She checked her BP this am when she was at work it was similar to right now.  Denies vision changes. The only thing she did different was sitting on a chair massage 2 days ago while having her nails done. She had been having some intermittent similar symptoms one year ago, but would resolve and has not addressed this with PCP. Denies dizziness or being off balance.  Took ASA today, # 2 81 mg.       Past Medical History:  Diagnosis Date  . History of mammogram 01/11/09; 03/16/15   cat 2 at [facility]; BENIGN  . History of Papanicolaou smear of cervix 12/27/08; 03/22/15   -/-; -/-  . Hyperlipidemia     Patient Active Problem List   Diagnosis Date Noted  . Essential hypertension 01/28/2017  . HLD (hyperlipidemia) 10/25/2015    Past Surgical History:  Procedure Laterality Date  . CESAREAN SECTION      OB History    Gravida  6   Para  3   Term  3   Preterm      AB  2   Living  3     SAB  1   TAB      Ectopic      Multiple      Live Births               Home Medications    Prior to Admission medications   Medication Sig Start Date End Date Taking? Authorizing Provider  aspirin EC 81 MG tablet Take 1 tablet by mouth daily. 02/11/17  Yes [provider]  ibuprofen (ADVIL,MOTRIN) 600 MG tablet Take 600 mg by mouth as needed. 03/27/14   [provider]  Multiple Vitamin (MULTI-VITAMINS) TABS Take 1 tablet by mouth daily.    [provider]    Family History Family History  Problem Relation Age of  Onset  . Leukemia Sister        DIED 2013/03/21  . Breast cancer Neg Hx     Social History Social History   Tobacco Use  . Smoking status: Never Smoker  . Smokeless tobacco: Never Used  Substance Use Topics  . Alcohol use: No    Alcohol/week: 2.0 standard drinks    Types: 2 Standard drinks or equivalent per week    Frequency: Never  . Drug use: No     Allergies   Patient has no known allergies.   Review of Systems Review of Systems  Constitutional: Negative for chills, diaphoresis and fever.  HENT: Negative for dental problem, ear discharge, facial swelling, rhinorrhea, sinus pain, sore throat, trouble swallowing and voice change.   Eyes: Negative for photophobia, discharge, itching and visual disturbance.  Respiratory: Negative for chest tightness and shortness of breath.   Cardiovascular: Negative for chest pain, palpitations and leg swelling.  Gastrointestinal: Negative for abdominal pain, nausea and vomiting.  Musculoskeletal: Positive for myalgias. Negative for gait problem, joint swelling, neck  pain and neck stiffness.  Skin: Negative for rash and wound.  Neurological: Negative for dizziness, tremors, facial asymmetry, speech difficulty, weakness, light-headedness, numbness and headaches.  Hematological: Negative for adenopathy.  Psychiatric/Behavioral: Negative for confusion.     Physical Exam Triage Vital Signs ED Triage Vitals  Enc Vitals Group     BP 07/21/18 1949 125/87     Pulse Rate 07/21/18 1949 76     Resp 07/21/18 1949 18     Temp 07/21/18 1949 98.4 F (36.9 C)     Temp Source 07/21/18 1949 Oral     SpO2 07/21/18 1949 99 %     Weight 07/21/18 1947 183 lb (83 kg)     Height 07/21/18 1947 5\' 5"  (1.651 m)     Head Circumference --      Peak Flow --      Pain Score 07/21/18 1947 7     Pain Loc --      Pain Edu? --      Excl. in GC? --    No data found.  Updated Vital Signs BP 125/87 (BP Location: Right Arm)   Pulse 76   Temp 98.4 F (36.9 C)  (Oral)   Resp 18   Ht 5\' 5"  (1.651 m)   Wt 183 lb (83 kg)   LMP  (LMP Unknown)   SpO2 99%   BMI 30.45 kg/m   Visual Acuity      Physical Exam  Constitutional: She is oriented to person, place, and time. She appears well-developed.  HENT:  Head: Normocephalic and atraumatic.  Right Ear: External ear normal.  Left Ear: External ear normal.  Nose: Nose normal.  Mouth/Throat: Oropharynx is clear and moist.  L face cheek bone is mildly tender down to lateral face and around L orbit, but no tenderness of ethmoids or frontal sinus and mildly tender on proximal maxillary L sinus.   Eyes: Pupils are equal, round, and reactive to light. Conjunctivae and EOM are normal.  Neck: Normal range of motion. Neck supple. No tracheal deviation present. No thyromegaly present.  No carotid bruits  Cardiovascular: Normal rate, regular rhythm, normal heart sounds and intact distal pulses. Exam reveals no gallop and no friction rub.  No murmur heard. Pulmonary/Chest: Effort normal. No respiratory distress. She has no wheezes. She has no rales. She exhibits no tenderness.  Musculoskeletal: Normal range of motion. She exhibits tenderness. She exhibits no edema or deformity.  Has mild muscle soreness on outer L upper arm, and lateral thigh. No asymmetry noted  Lymphadenopathy:    She has no cervical adenopathy.  Neurological: She is alert and oriented to person, place, and time. She displays normal reflexes. No cranial nerve deficit or sensory deficit. She exhibits normal muscle tone. Coordination normal.  Normal Romberg, has symmetric upper and lower extremity strength. No facial asymmetry noted. Her speech is normal. Finger to nose test is normal. Tandem walk is normal. Heel and tip toe gait is normal.   Skin: Skin is warm and dry. Capillary refill takes less than 2 seconds. No rash noted.  Psychiatric: She has a normal mood and affect. Her behavior is normal. Judgment and thought content normal.     UC  Treatments / Results  Labs (all labs ordered are listed, but only abnormal results are displayed) Labs Reviewed - No data to display  EKG None  Radiology No results found.  Procedures Procedures (including critical care time)  Medications Ordered in UC Medications - No data to display  Initial Impression / Assessment and Plan / UC Course  I have reviewed the triage vital signs and the nursing notes.   Final Clinical Impressions(s) / UC Diagnoses   Final diagnoses:  Left-sided face pain  Left upper arm pain  Left leg pain     Discharge Instructions     Right now I do not find signs of stroke and your neurological exam is completely normal. If you get worse over night, go to the Er. Review the symptoms if stroke I told you to watch out with your husband and if you have those changes he needs to take you to the ER Follow up with family Dr this week.  If you are worried and want more test done right away, then you need to go to ER.    ED Prescriptions    None     Controlled Substance Prescriptions    Garey Ham, Cordelia Poche 07/21/18 2103

## 2019-10-25 ENCOUNTER — Other Ambulatory Visit: Payer: Self-pay | Admitting: Family Medicine

## 2019-10-25 DIAGNOSIS — R920 Mammographic microcalcification found on diagnostic imaging of breast: Secondary | ICD-10-CM

## 2020-03-30 ENCOUNTER — Ambulatory Visit
Admission: RE | Admit: 2020-03-30 | Discharge: 2020-03-30 | Disposition: A | Discharge status: Home / Self Care | Payer: PRIVATE HEALTH INSURANCE | Source: Ambulatory Visit | Attending: Family Medicine | Admitting: Family Medicine

## 2020-03-30 DIAGNOSIS — R920 Mammographic microcalcification found on diagnostic imaging of breast: Secondary | ICD-10-CM | POA: Insufficient documentation

## 2020-04-03 ENCOUNTER — Other Ambulatory Visit: Payer: Self-pay | Admitting: Family Medicine

## 2020-04-03 DIAGNOSIS — R928 Other abnormal and inconclusive findings on diagnostic imaging of breast: Secondary | ICD-10-CM

## 2020-04-03 DIAGNOSIS — N6310 Unspecified lump in the right breast, unspecified quadrant: Secondary | ICD-10-CM

## 2020-04-10 ENCOUNTER — Ambulatory Visit
Admission: RE | Admit: 2020-04-10 | Discharge: 2020-04-10 | Disposition: A | Discharge status: Home / Self Care | Payer: PRIVATE HEALTH INSURANCE | Source: Ambulatory Visit | Attending: Family Medicine | Admitting: Family Medicine

## 2020-04-10 DIAGNOSIS — N6310 Unspecified lump in the right breast, unspecified quadrant: Secondary | ICD-10-CM | POA: Insufficient documentation

## 2020-04-10 DIAGNOSIS — R928 Other abnormal and inconclusive findings on diagnostic imaging of breast: Secondary | ICD-10-CM | POA: Insufficient documentation

## 2020-04-11 LAB — SURGICAL PATHOLOGY

## 2020-04-16 ENCOUNTER — Telehealth: Payer: Self-pay

## 2020-04-16 NOTE — Telephone Encounter (Signed)
I spoke with the patient and she was at the airport to [place] her work.  She will not be back until after Sept 20, 2021.  At that time she will either contact her physician or [place] to get a referral to see a surgeon.

## 2021-05-02 IMAGING — US US BREAST*R* LIMITED INC AXILLA
1 series · 12 of 12 positions shown · non-contrast
Comparison: Previous exam(s).

CLINICAL DATA: 54-year-old who had a screening detected RIGHT
breast mass associated with calcifications in January 2018, for which
stereotactic biopsy was recommended. However, the patient has been
out of the country for the past 2 years and the biopsy was not
performed. She returns now for follow-up.Annual evaluation, LEFT
breast.

The patient describes remote trauma to the breasts.
EXAM:
DIGITAL DIAGNOSTIC BILATERAL MAMMOGRAM WITH CAD AND TOMO
ULTRASOUND RIGHT BREAST

[Series 1: us breast*right* limited inc axilla · 0.06mm/px · 12 of 12 slices shown]
[im 1/12]
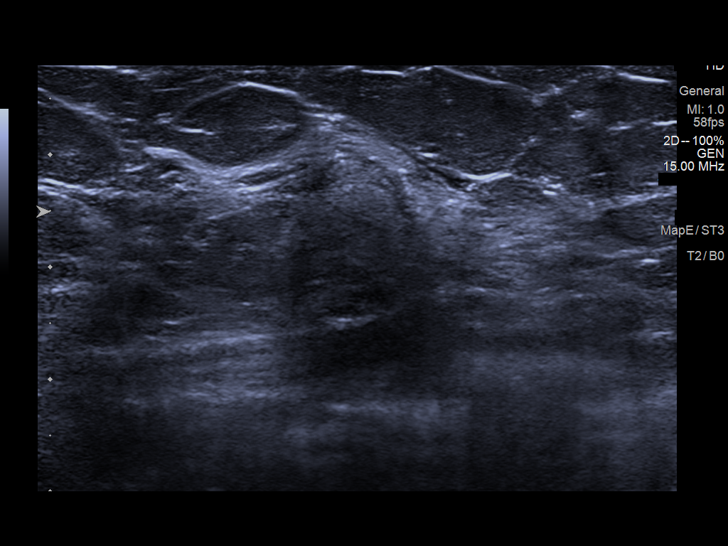
[im 2/12]
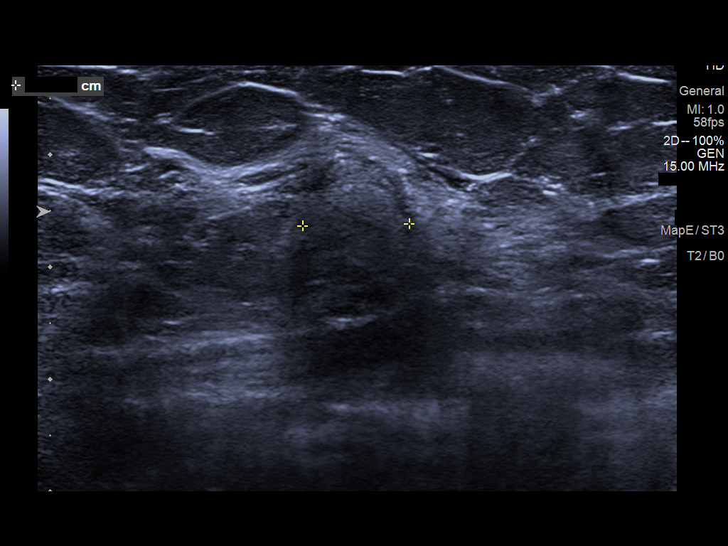
[im 3/12]
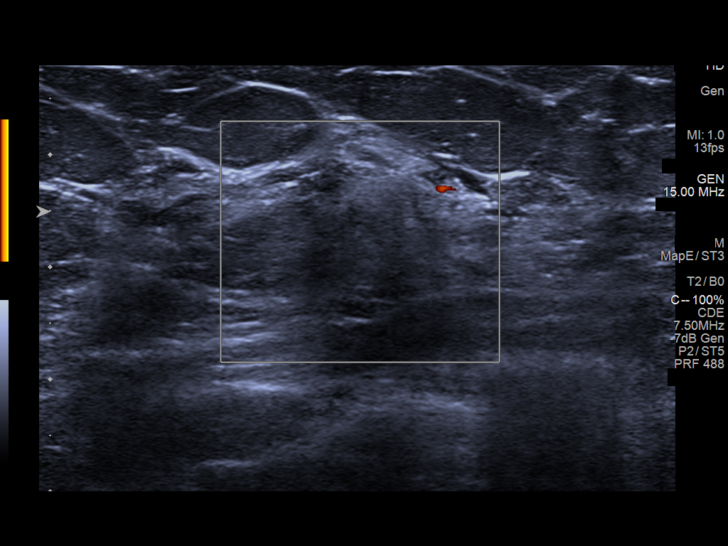
[im 4/12]
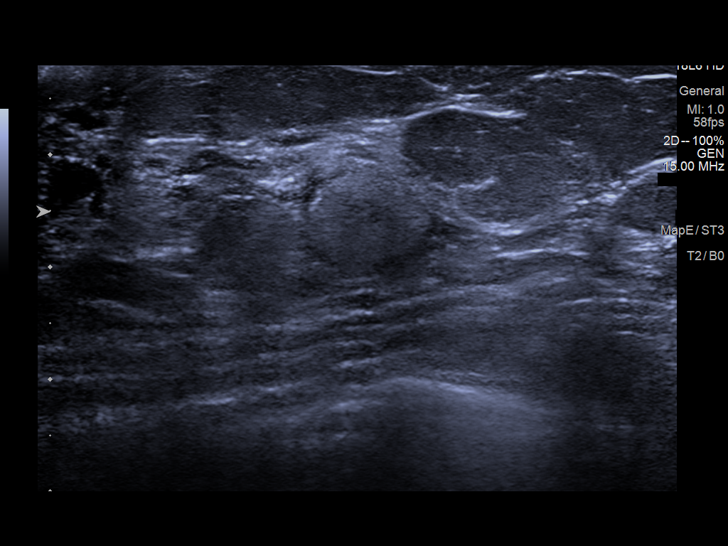
[im 5/12]
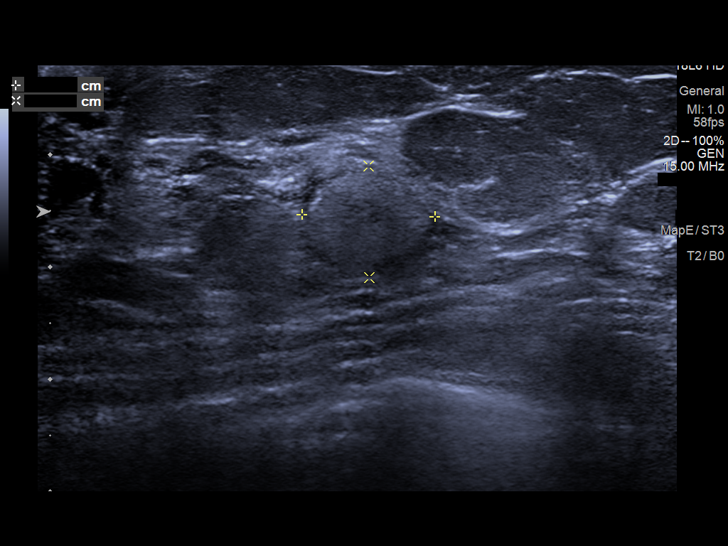
[im 6/12]
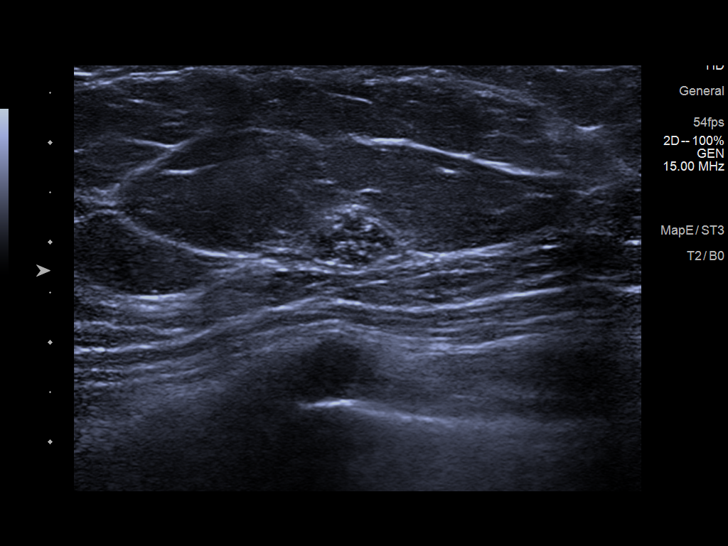
[im 7/12]
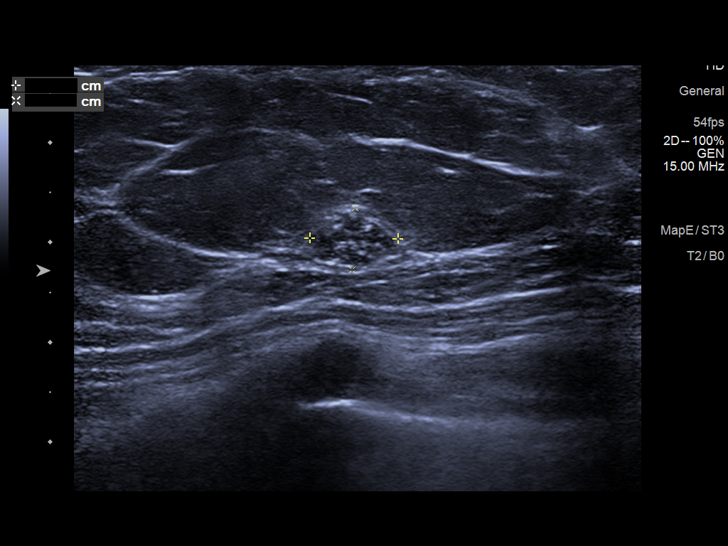
[im 8/12]
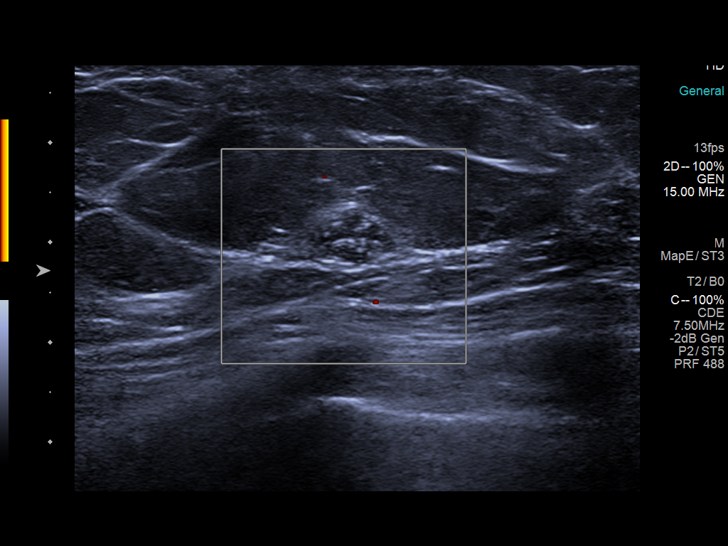
[im 9/12]
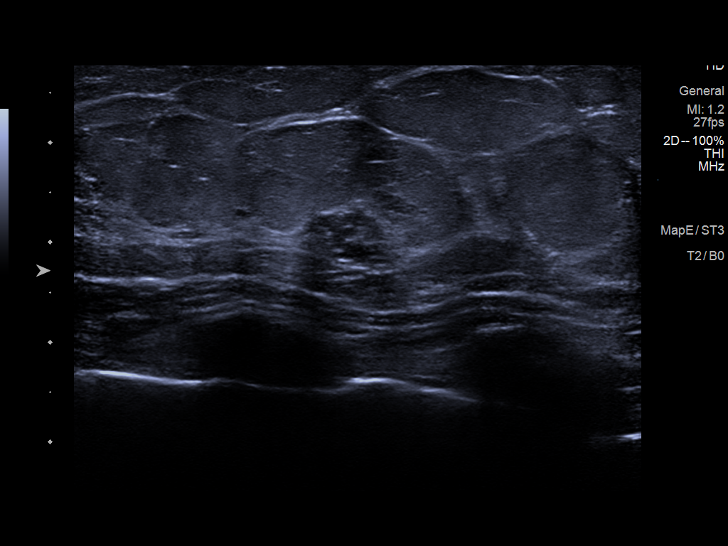
[im 10/12]
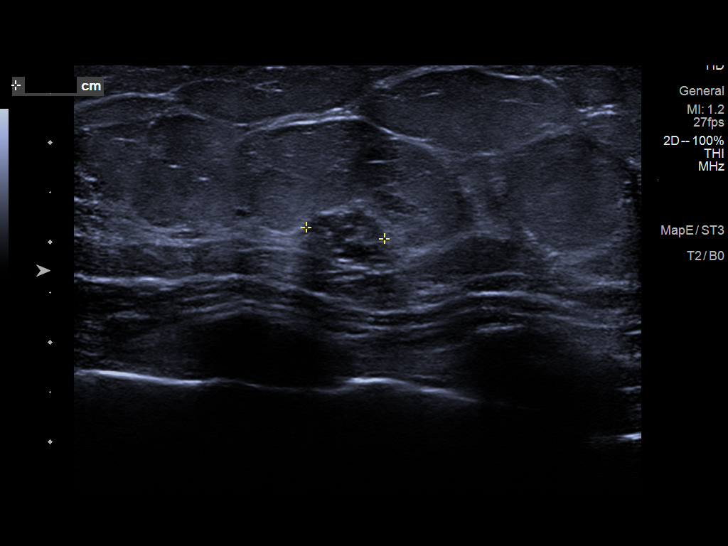
[im 11/12]
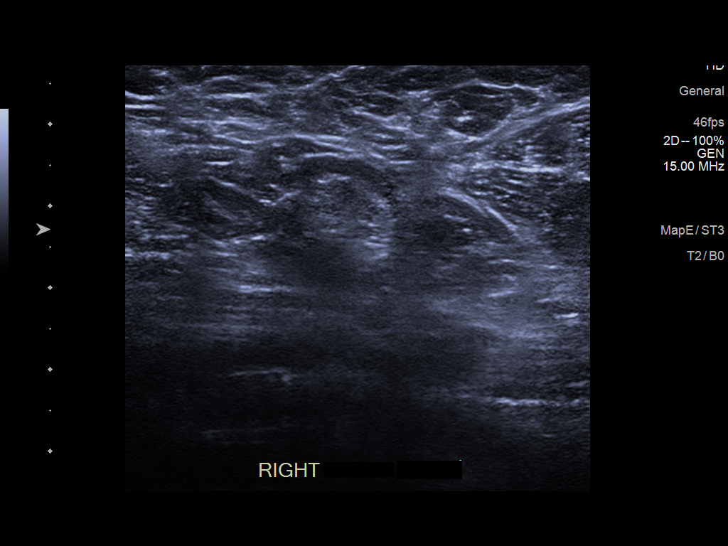
[im 12/12]
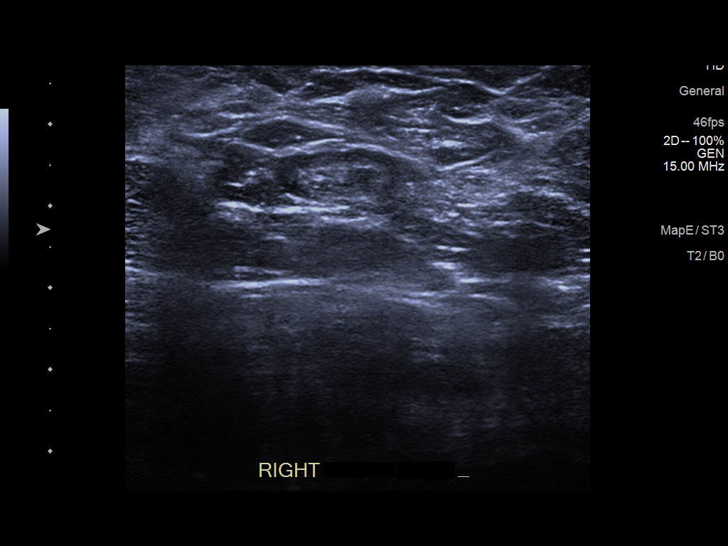

[12 of 12 positions shown; findings below may reference images not displayed]

ACR Breast Density Category c: The breast tissue is heterogeneously
dense, which may obscure small masses.
FINDINGS: Tomosynthesis and synthesized full field CC and MLO views of both
breasts were obtained. Spot magnification CC and mediolateral views
of the previously identified RIGHT breast calcifications, a
tomosynthesis and synthesized spot-compression CC view of an area of
concern in RIGHT breast and a tomosynthesis and synthesized full
field mediolateral view of the RIGHT breast were also obtained.

RIGHT: The previously identified partially calcified mass in the
LOWER INNER QUADRANT has increased in size since the prior mammogram
in 5708, now measuring approximately 1.1 cm (previously 0.5 cm).
There is a new low-density mass with associated calcifications
adjacent to this dominant mass. Focal architectural distortion
suspected in the INNER breast visible only on the full field CC
images did not persist on the spot compression images, though there
are faint punctate calcifications in this location, similar in
morphology to the calcifications associated with the masses.

Targeted RIGHT breast ultrasound is performed, showing the
previously identified isoechoic mass at the 4 o'clock position
approximately 3 cm from nipple at MIDDLE to POSTERIOR depth has
increased in size, currently measuring approximately 1.2 x 1.0 x
cm, demonstrating mixed posterior characteristics and demonstrating
no internal power Doppler flow.

A new hypoechoic mass containing calcifications is identified at the
3:30 o'clock position approximately 4 cm from the nipple at
POSTERIOR depth measuring approximately 0.8 x 0.6 x 0.8 cm,
demonstrating no posterior characteristics and no internal power
Doppler flow, corresponding to the new mammographic finding.

Sonographic evaluation of the RIGHT axilla demonstrates no
pathologic lymphadenopathy.

LEFT: No findings suspicious for malignancy.

Mammographic images were processed with CAD.

On physical exam, there is palpable firmness in the LOWER INNER
QUADRANT of the RIGHT breast.
IMPRESSION: 1. Interval increase in size of the partially calcified mass
involving LOWER INNER QUADRANT of the RIGHT breast at the 4 o'clock
position approximately 3 cm from the nipple, with measurements given
above.
2. Adjacent new partially calcified mass involving the LOWER INNER
QUADRANT of the RIGHT breast at the 3:30 o'clock position
approximately 4 cm from the nipple.
3. No pathologic RIGHT axillary lymphadenopathy.
4. No mammographic evidence of malignancy involving the LEFT breast.

RECOMMENDATION:
Ultrasound-guided core needle biopsy of the 2 RIGHT breast masses
(the enlarging mass at the 4 o'clock position and the new mass at
the 3:30 o'clock position). It is possible that these represent
benign fat necrosis/oil cysts, but the fact that the previously
identified mass has increased in size and the fact that the second
mass is new requires tissue diagnosis.

The ultrasound core needle biopsy procedure was discussed with the
patient and her questions were answered. She wishes to proceed. The
schedulers at the [HOSPITAL] will contact the patient
and arrange the biopsy.

I have discussed the findings and recommendations with the patient.

BI-RADS CATEGORY  4: Suspicious.

## 2022-01-15 ENCOUNTER — Ambulatory Visit
Admission: EM | Admit: 2022-01-15 | Discharge: 2022-01-15 | Disposition: A | Discharge status: Home / Self Care | Payer: 59 | Attending: Emergency Medicine | Admitting: Emergency Medicine

## 2022-01-15 ENCOUNTER — Other Ambulatory Visit: Payer: Self-pay

## 2022-01-15 DIAGNOSIS — I10 Essential (primary) hypertension: Secondary | ICD-10-CM

## 2022-01-15 MED ORDER — AMLODIPINE BESYLATE 5 MG PO TABS: 5.0000 mg | ORAL_TABLET | Freq: Every day | ORAL | 0 refills | Status: AC

## 2022-01-15 NOTE — ED Provider Notes (Signed)
?[facility] ? ? ? ?CSN: [id number] ?Arrival date & time: 01/15/22  1118-03-05 ? ? ?  ? ?History   ?Chief Complaint ?Chief Complaint  ?Patient presents with  ? Hypertension  ? ? ?HPI ?Morgan Bartlett is a 56 y.o. female.  ? ?Patient presents herself today with concerns of elevated blood pressure.  Patient states few years ago she was on Norvasc for hypertension was able to come off of this per her PCP with diet and exercise.  Patient has had increased stress over the past few weeks with family passing and other issues.  Patient states that she has been checking her blood pressure even before the situation and her pressure is still elevated.  Patient would like to begin back on her Norvasc she is not able to get in with her PCP for the next few weeks.  States she does have intermittent headaches none currently.  Denies any shortness of breath no chest pain.  No signs of weakness.  ? ? ?Past Medical History:  ?Diagnosis Date  ? History of mammogram 01/11/09; 03/16/15  ? cat 2 at [facility]; BENIGN  ? History of Papanicolaou smear of cervix 12/27/08; 03/22/15  ? -/-; -/-  ? Hyperlipidemia   ? ? ?Patient Active Problem List  ? Diagnosis Date Noted  ? Essential hypertension 01/28/2017  ? HLD (hyperlipidemia) 10/25/2015  ? ? ?Past Surgical History:  ?Procedure Laterality Date  ? CESAREAN SECTION    ? ? ?OB History   ? ? Gravida  ?6  ? Para  ?3  ? Term  ?3  ? Preterm  ?   ? AB  ?2  ? Living  ?3  ?  ? ? SAB  ?1  ? IAB  ?   ? Ectopic  ?   ? Multiple  ?   ? Live Births  ?   ?   ?  ?  ? ? ? ?Home Medications   ? ?Prior to Admission medications   ?Medication Sig Start Date End Date Taking? Authorizing Provider  ?amLODipine (NORVASC) 5 MG tablet Take 1 tablet (5 mg total) by mouth daily. 01/15/22  Yes Coralyn Mark, NP  ?aspirin EC 81 MG tablet Take 1 tablet by mouth daily. 02/11/17  Yes [provider]  ?ibuprofen (ADVIL,MOTRIN) 600 MG tablet Take 600 mg by mouth as needed. 03/27/14  Yes [provider]  ?Multiple  Vitamin (MULTI-VITAMINS) TABS Take 1 tablet by mouth daily.   Yes [provider]  ? ? ?Family History ?Family History  ?Problem Relation Age of Onset  ? Leukemia Sister   ?     DIED Mar 05, 2013  ? Breast cancer Neg Hx   ? ? ?Social History ?Social History  ? ?Tobacco Use  ? Smoking status: Never  ? Smokeless tobacco: Never  ?Vaping Use  ? Vaping Use: Never used  ?Substance Use Topics  ? Alcohol use: No  ?  Alcohol/week: 2.0 standard drinks  ?  Types: 2 Standard drinks or equivalent per week  ? Drug use: No  ? ? ? ?Allergies   ?Patient has no known allergies. ? ? ?Review of Systems ?Review of Systems  ?Constitutional: Negative.   ?HENT: Negative.    ?Respiratory: Negative.    ?Cardiovascular: Negative.  Negative for chest pain, palpitations and leg swelling.  ?Gastrointestinal: Negative.   ?Genitourinary: Negative.   ?Neurological:  Positive for headaches.  ?     Intermittent headaches none today  ?Psychiatric/Behavioral:    ?  Stressful situation of the family's death this week  ? ? ?Physical Exam ?Triage Vital Signs ?ED Triage Vitals  ?Enc Vitals Group  ?   BP 01/15/22 1136 (!) 150/86  ?   Pulse Rate 01/15/22 1136 86  ?   Resp 01/15/22 1136 18  ?   Temp 01/15/22 1136 98.8 ?F (37.1 ?C)  ?   Temp Source 01/15/22 1136 Oral  ?   SpO2 01/15/22 1136 99 %  ?   Weight 01/15/22 1134 180 lb (81.6 kg)  ?   Height 01/15/22 1134 5\' 6"  (1.676 m)  ?   Head Circumference --   ?   Peak Flow --   ?   Pain Score 01/15/22 1134 0  ?   Pain Loc --   ?   Pain Edu? --   ?   Excl. in GC? --   ? ?No data found. ? ?Updated Vital Signs ?BP (!) 150/86 (BP Location: Left Arm)   Pulse 86   Temp 98.8 ?F (37.1 ?C) (Oral)   Resp 18   Ht 5\' 6"  (1.676 m)   Wt 180 lb (81.6 kg)   LMP  (LMP Unknown)   SpO2 99%   BMI 29.05 kg/m?  ? ?Visual Acuity ?Right Eye Distance:   ?Left Eye Distance:   ?Bilateral Distance:   ? ?Right Eye Near:   ?Left Eye Near:    ?Bilateral Near:    ? ?Physical Exam ?Constitutional:   ?   Appearance: Normal  appearance. She is normal weight.  ?Cardiovascular:  ?   Rate and Rhythm: Normal rate.  ?   Pulses: Normal pulses.  ?   Heart sounds: Normal heart sounds.  ?Pulmonary:  ?   Effort: Pulmonary effort is normal.  ?Abdominal:  ?   General: Abdomen is flat.  ?Musculoskeletal:     ?   General: Normal range of motion.  ?Skin: ?   General: Skin is warm.  ?Neurological:  ?   General: No focal deficit present.  ?   Mental Status: She is alert.  ? ? ? ?UC Treatments / Results  ?Labs ?(all labs ordered are listed, but only abnormal results are displayed) ?Labs Reviewed - No data to display ? ?EKG ? ? ?Radiology ?No results found. ? ?Procedures ?Procedures (including critical care time) ? ?Medications Ordered in UC ?Medications - No data to display ? ?Initial Impression / Assessment and Plan / UC Course  ?I have reviewed the triage vital signs and the nursing notes. ? ?Pertinent labs & imaging results that were available during my care of the patient were reviewed by me and considered in my medical decision making (see chart for details). ? ?  ? ?Discussed with patient she needs to monitor her sodium intake ?Check blood pressure daily keep a log.  Patient will need to contact her PCP to be monitored on blood pressure medicine. ?If patient begins to have weakness to 1 side of the body chest pain she will need to be seen in the emergency room for further testing ?Cautious with sitting to standing while starting blood pressure medicine this can cause orthostatic hypotension ?Drink plenty of water and avoid caffeinated drinks ?Patient did mention wanting to try more natural things for her blood pressure we discussed niacin.  I educated patient she will need to discuss this with her PCP to see if this is a good choice for her ? ?Final Clinical Impressions(s) / UC Diagnoses  ? ?Final diagnoses:  ?Primary hypertension  ? ? ? ?  Discharge Instructions   ? ?  ?Discussed with patient she needs to monitor her sodium intake ?Check blood  pressure daily keep a log.  Patient will need to contact her PCP to be monitored on blood pressure medicine. ?If patient begins to have weakness to 1 side of the body chest pain she will need to be seen in the emergency room for further testing ?Cautious with sitting to standing while starting blood pressure medicine this can cause orthostatic hypotension ?Drink plenty of water and avoid caffeinated drinks ? ? ? ? ? ?ED Prescriptions   ? ? Medication Sig Dispense Auth. Provider  ? amLODipine (NORVASC) 5 MG tablet Take 1 tablet (5 mg total) by mouth daily. 30 tablet Coralyn MarkMitchell, Haytham Maher L, NP  ? ?  ? ?PDMP not reviewed this encounter. ?  ?Coralyn MarkMitchell, Anavictoria Wilk L, NP ?01/15/22 1207 ? ?

## 2022-01-15 NOTE — ED Triage Notes (Signed)
Pt here with C/O that BP was high this morning with a reading, no SX. 154/105,170/105. Nephew did pass away Monday ?

## 2022-01-15 NOTE — Discharge Instructions (Signed)
Discussed with patient she needs to monitor her sodium intake ?Check blood pressure daily keep a log.  Patient will need to contact her PCP to be monitored on blood pressure medicine. ?If patient begins to have weakness to 1 side of the body chest pain she will need to be seen in the emergency room for further testing ?Cautious with sitting to standing while starting blood pressure medicine this can cause orthostatic hypotension ?Drink plenty of water and avoid caffeinated drinks ? ?
